# Patient Record
Sex: Female | Born: 1945 | Race: White | Hispanic: No | Marital: Married | Smoking: Never smoker
Health system: Southern US, Community
[De-identification: ages and names within clinical notes are randomized; demographics above are authoritative.]

## PROBLEM LIST (undated history)

## (undated) DIAGNOSIS — E7800 Pure hypercholesterolemia, unspecified: Secondary | ICD-10-CM

## (undated) DIAGNOSIS — I4891 Unspecified atrial fibrillation: Secondary | ICD-10-CM

## (undated) DIAGNOSIS — K5732 Diverticulitis of large intestine without perforation or abscess without bleeding: Secondary | ICD-10-CM

## (undated) DIAGNOSIS — C55 Malignant neoplasm of uterus, part unspecified: Secondary | ICD-10-CM

## (undated) DIAGNOSIS — R002 Palpitations: Secondary | ICD-10-CM

## (undated) DIAGNOSIS — K449 Diaphragmatic hernia without obstruction or gangrene: Secondary | ICD-10-CM

## (undated) DIAGNOSIS — I10 Essential (primary) hypertension: Secondary | ICD-10-CM

## (undated) DIAGNOSIS — K219 Gastro-esophageal reflux disease without esophagitis: Secondary | ICD-10-CM

## (undated) DIAGNOSIS — J45909 Unspecified asthma, uncomplicated: Secondary | ICD-10-CM

## (undated) HISTORY — PX: ABDOMINAL HYSTERECTOMY: SHX81

## (undated) HISTORY — PX: URETHRA SURGERY: SHX824

## (undated) HISTORY — PX: CHOLECYSTECTOMY: SHX55

---

## 2015-10-24 ENCOUNTER — Emergency Department (HOSPITAL_BASED_OUTPATIENT_CLINIC_OR_DEPARTMENT_OTHER): Payer: Medicare Other

## 2015-10-24 ENCOUNTER — Emergency Department (HOSPITAL_BASED_OUTPATIENT_CLINIC_OR_DEPARTMENT_OTHER)
Admission: EM | Admit: 2015-10-24 | Discharge: 2015-10-24 | Disposition: A | Discharge status: Home / Self Care | Payer: Medicare Other | Attending: Emergency Medicine | Admitting: Emergency Medicine

## 2015-10-24 ENCOUNTER — Encounter (HOSPITAL_BASED_OUTPATIENT_CLINIC_OR_DEPARTMENT_OTHER): Payer: Self-pay

## 2015-10-24 DIAGNOSIS — Z792 Long term (current) use of antibiotics: Secondary | ICD-10-CM | POA: Diagnosis not present

## 2015-10-24 DIAGNOSIS — Z8639 Personal history of other endocrine, nutritional and metabolic disease: Secondary | ICD-10-CM | POA: Insufficient documentation

## 2015-10-24 DIAGNOSIS — Z8541 Personal history of malignant neoplasm of cervix uteri: Secondary | ICD-10-CM | POA: Insufficient documentation

## 2015-10-24 DIAGNOSIS — Z7951 Long term (current) use of inhaled steroids: Secondary | ICD-10-CM | POA: Insufficient documentation

## 2015-10-24 DIAGNOSIS — Z90710 Acquired absence of both cervix and uterus: Secondary | ICD-10-CM | POA: Insufficient documentation

## 2015-10-24 DIAGNOSIS — K529 Noninfective gastroenteritis and colitis, unspecified: Secondary | ICD-10-CM | POA: Diagnosis not present

## 2015-10-24 DIAGNOSIS — Z9049 Acquired absence of other specified parts of digestive tract: Secondary | ICD-10-CM | POA: Insufficient documentation

## 2015-10-24 DIAGNOSIS — Z79899 Other long term (current) drug therapy: Secondary | ICD-10-CM | POA: Insufficient documentation

## 2015-10-24 DIAGNOSIS — Z9889 Other specified postprocedural states: Secondary | ICD-10-CM | POA: Insufficient documentation

## 2015-10-24 DIAGNOSIS — I10 Essential (primary) hypertension: Secondary | ICD-10-CM | POA: Diagnosis not present

## 2015-10-24 DIAGNOSIS — J45909 Unspecified asthma, uncomplicated: Secondary | ICD-10-CM | POA: Insufficient documentation

## 2015-10-24 DIAGNOSIS — K219 Gastro-esophageal reflux disease without esophagitis: Secondary | ICD-10-CM | POA: Insufficient documentation

## 2015-10-24 DIAGNOSIS — Z87891 Personal history of nicotine dependence: Secondary | ICD-10-CM | POA: Diagnosis not present

## 2015-10-24 DIAGNOSIS — Z7982 Long term (current) use of aspirin: Secondary | ICD-10-CM | POA: Diagnosis not present

## 2015-10-24 DIAGNOSIS — R1110 Vomiting, unspecified: Secondary | ICD-10-CM | POA: Diagnosis present

## 2015-10-24 HISTORY — DX: Unspecified asthma, uncomplicated: J45.909

## 2015-10-24 HISTORY — DX: Pure hypercholesterolemia, unspecified: E78.00

## 2015-10-24 HISTORY — DX: Diaphragmatic hernia without obstruction or gangrene: K44.9

## 2015-10-24 HISTORY — DX: Essential (primary) hypertension: I10

## 2015-10-24 HISTORY — DX: Palpitations: R00.2

## 2015-10-24 HISTORY — DX: Unspecified atrial fibrillation: I48.91

## 2015-10-24 HISTORY — DX: Diverticulitis of large intestine without perforation or abscess without bleeding: K57.32

## 2015-10-24 HISTORY — DX: Gastro-esophageal reflux disease without esophagitis: K21.9

## 2015-10-24 HISTORY — DX: Malignant neoplasm of uterus, part unspecified: C55

## 2015-10-24 LAB — I-STAT CG4 LACTIC ACID, ED: Lactic Acid, Venous: 1.28 mmol/L (ref 0.5–2.0)

## 2015-10-24 LAB — COMPREHENSIVE METABOLIC PANEL
ALT: 26 U/L (ref 14–54)
AST: 26 U/L (ref 15–41)
Albumin: 3.9 g/dL (ref 3.5–5.0)
Alkaline Phosphatase: 80 U/L (ref 38–126)
Anion gap: 8 (ref 5–15)
BUN: 22 mg/dL — ABNORMAL HIGH (ref 6–20)
CO2: 25 mmol/L (ref 22–32)
Calcium: 9 mg/dL (ref 8.9–10.3)
Chloride: 107 mmol/L (ref 101–111)
Creatinine, Ser: 0.86 mg/dL (ref 0.44–1.00)
GFR calc Af Amer: >60 mL/min (ref >60)
GFR calc non Af Amer: >60 mL/min (ref >60)
Glucose, Bld: 151 mg/dL — ABNORMAL HIGH (ref 65–99)
Potassium: 4.4 mmol/L (ref 3.5–5.1)
Sodium: 140 mmol/L (ref 135–145)
Total Bilirubin: 0.8 mg/dL (ref 0.3–1.2)
Total Protein: 6.9 g/dL (ref 6.5–8.1)

## 2015-10-24 LAB — URINALYSIS, ROUTINE W REFLEX MICROSCOPIC
Bilirubin Urine: NEGATIVE
Glucose, UA: NEGATIVE mg/dL
Hgb urine dipstick: NEGATIVE
Ketones, ur: 40 mg/dL — AB
Leukocytes, UA: NEGATIVE
Nitrite: NEGATIVE
Protein, ur: NEGATIVE mg/dL
Specific Gravity, Urine: 1.026 (ref 1.005–1.030)
pH: 6.5 (ref 5.0–8.0)

## 2015-10-24 LAB — CBC WITH DIFFERENTIAL/PLATELET
Basophils Absolute: 0 10*3/uL (ref 0.0–0.1)
Basophils Relative: 0 %
Eosinophils Absolute: 0 10*3/uL (ref 0.0–0.7)
Eosinophils Relative: 0 %
HCT: 46.4 % — ABNORMAL HIGH (ref 36.0–46.0)
Hemoglobin: 15.1 g/dL — ABNORMAL HIGH (ref 12.0–15.0)
Lymphocytes Relative: 4 %
Lymphs Abs: 0.3 10*3/uL — ABNORMAL LOW (ref 0.7–4.0)
MCH: 28.1 pg (ref 26.0–34.0)
MCHC: 32.5 g/dL (ref 30.0–36.0)
MCV: 86.4 fL (ref 78.0–100.0)
Monocytes Absolute: 0.3 10*3/uL (ref 0.1–1.0)
Monocytes Relative: 4 %
Neutro Abs: 7.7 10*3/uL (ref 1.7–7.7)
Neutrophils Relative %: 92 %
Platelets: 243 10*3/uL (ref 150–400)
RBC: 5.37 MIL/uL — ABNORMAL HIGH (ref 3.87–5.11)
RDW: 13.4 % (ref 11.5–15.5)
WBC: 8.3 10*3/uL (ref 4.0–10.5)

## 2015-10-24 MED ORDER — PROMETHAZINE HCL 25 MG/ML IJ SOLN
12.5000 mg | Freq: Once | INTRAMUSCULAR | Status: AC
  Administered 2015-10-24: 12.5 mg via INTRAVENOUS
  Filled 2015-10-24: qty 1

## 2015-10-24 MED ORDER — IOHEXOL 300 MG/ML  SOLN
100.0000 mL | Freq: Once | INTRAMUSCULAR | Status: AC | PRN
  Administered 2015-10-24: 100 mL via INTRAVENOUS

## 2015-10-24 MED ORDER — ONDANSETRON HCL 4 MG/2ML IJ SOLN
4.0000 mg | Freq: Once | INTRAMUSCULAR | Status: AC
Start: 2015-10-24 — End: 2015-10-24
  Administered 2015-10-24: 4 mg via INTRAVENOUS
  Filled 2015-10-24: qty 2

## 2015-10-24 MED ORDER — PROMETHAZINE HCL 25 MG PO TABS: 25.0000 mg | ORAL_TABLET | Freq: Three times a day (TID) | ORAL | Status: AC | PRN

## 2015-10-24 MED ORDER — SODIUM CHLORIDE 0.9 % IV BOLUS (SEPSIS)
1000.0000 mL | Freq: Once | INTRAVENOUS | Status: AC
  Administered 2015-10-24: 1000 mL via INTRAVENOUS

## 2015-10-24 MED ORDER — IOHEXOL 300 MG/ML  SOLN
50.0000 mL | Freq: Once | INTRAMUSCULAR | Status: AC | PRN
  Administered 2015-10-24: 50 mL via ORAL

## 2015-10-24 NOTE — ED Notes (Signed)
Patient transported to CT 

## 2015-10-24 NOTE — ED Notes (Signed)
PA student at bedside.

## 2015-10-24 NOTE — ED Notes (Signed)
Pt able to tolerate PO fluids and verbalizes understanding of d/c instructions and denies any further needs at this time.

## 2015-10-24 NOTE — ED Notes (Signed)
C/o vomiting since 12MN-denies diarrhea

## 2015-10-24 NOTE — Discharge Instructions (Signed)
Return here as needed.  Follow-up with your primary care doctor.  Slowly increase your fluid intake and rest as much as possible °

## 2015-10-24 NOTE — ED Provider Notes (Signed)
CSN: [id number]     Arrival date & time 10/24/15  1543 History   First MD Initiated Contact with Patient 10/24/15 1602     Chief Complaint  Patient presents with  . Vomiting   HPI  Patient is a pleasant 69 y/o WF here with a CC of persistent vomiting since midnight.  Vomit was green in color at the onset but is now yellow. No blood in vomitus. She has vomited once per hour since midnight.  She endorses vague cramping abdominal pain.  She cannot tolerate anything po and has felt lightheaded as the day progressed. Denies fever, diarrhea, recent changes in diet, recent changes in medications, or sick contacts. She had a cholecystectomy in 2012.  Patient denies weakness, dizziness, headache, blurred vision, chest pain, shortness of breath, back pain, neck pain, fever, cough, incontinence, dysuria, hematemesis, bloody stool, rash, near syncope or syncope.  The patient states that she did not take any medications prior to arrival  Past Medical History  Diagnosis Date  . Asthma   . Heart palpitations   . Uterine cancer ([place])   . A-fib ([place])   . Hernia, diaphragmatic   . Hypertension   . Diverticulitis of colon   . GERD (gastroesophageal reflux disease)   . Hypercholesteremia    Past Surgical History  Procedure Laterality Date  . Abdominal hysterectomy    . Cholecystectomy    . Urethra surgery     No family history on file. Social History  Substance Use Topics  . Smoking status: Never Smoker   . Smokeless tobacco: None  . Alcohol Use: No   OB History    No data available     Review of Systems All other systems negative except as documented in the HPI. All pertinent positives and negatives as reviewed in the HPI.    Allergies  Biaxin  Home Medications   Prior to Admission medications   Medication Sig Start Date End Date Taking? Authorizing Provider  ALBUTEROL IN Inhale into the lungs.   Yes Historical Provider, MD  aspirin EC 325 MG tablet Take 325 mg by mouth daily.   Yes  Historical Provider, MD  atenolol (TENORMIN) 50 MG tablet Take 50 mg by mouth daily.   Yes Historical Provider, MD  azelastine (ASTELIN) 0.1 % nasal spray Place into both nostrils 2 (two) times daily. Use in each nostril as directed   Yes Historical Provider, MD  azithromycin (ZITHROMAX) 250 MG tablet Take by mouth daily.   Yes Historical Provider, MD  hydrochlorothiazide (HYDRODIURIL) 25 MG tablet Take 25 mg by mouth daily.   Yes Historical Provider, MD  ibuprofen (ADVIL,MOTRIN) 200 MG tablet Take 200 mg by mouth every 6 (six) hours as needed.   Yes Historical Provider, MD  Lansoprazole (PREVACID PO) Take by mouth.   Yes Historical Provider, MD  Methylcellulose, Laxative, (MIRAFIBER PO) Take by mouth.   Yes Historical Provider, MD  Mometasone Furoate [facility] HFA IN) Inhale into the lungs.   Yes Historical Provider, MD  potassium chloride SA (K-DUR,KLOR-CON) 20 MEQ tablet Take 20 mEq by mouth 2 (two) times daily.   Yes Historical Provider, MD  verapamil (VERELAN PM) 240 MG 24 hr capsule Take 240 mg by mouth at bedtime.   Yes Historical Provider, MD   BP 158/68 mmHg  Pulse 88  Temp(Src) 98.2 F (36.8 C) (Oral)  Resp 18  Ht 5\' 5"  (1.651 m)  Wt 104.327 kg  BMI 38.27 kg/m2  SpO2 98% Physical Exam  Constitutional: She  is oriented to person, place, and time. Vital signs are normal. She appears well-developed and well-nourished.  Non-toxic appearance. No distress.  HENT:  Head: Normocephalic and atraumatic.  Mouth/Throat: Oropharynx is clear and moist.  Eyes: Pupils are equal, round, and reactive to light.  Neck: Normal range of motion. Neck supple.  Cardiovascular: Normal rate, regular rhythm and normal heart sounds.  Exam reveals no gallop and no friction rub.   No murmur heard. Pulmonary/Chest: Effort normal and breath sounds normal. No respiratory distress. She has no wheezes.  Abdominal: Soft. Normal appearance and bowel sounds are normal. She exhibits no distension and no fluid wave.  There is tenderness. There is no rebound and no guarding.    Neurological: She is alert and oriented to person, place, and time. She exhibits normal muscle tone. Coordination normal.  Skin: Skin is warm and dry. No rash noted. She is not diaphoretic. No erythema.  Psychiatric: She has a normal mood and affect. Her behavior is normal.  Nursing note and vitals reviewed.   ED Course  Procedures (including critical care time) Labs Review Labs Reviewed  COMPREHENSIVE METABOLIC PANEL - Abnormal; Notable for the following:    Glucose, Bld 151 (*)    BUN 22 (*)    All other components within normal limits  CBC WITH DIFFERENTIAL/PLATELET - Abnormal; Notable for the following:    RBC 5.37 (*)    Hemoglobin 15.1 (*)    HCT 46.4 (*)    Lymphs Abs 0.3 (*)    All other components within normal limits  URINALYSIS, ROUTINE W REFLEX MICROSCOPIC (NOT AT [facility])  I-STAT CG4 LACTIC ACID, ED    Imaging Review No results found. I have personally reviewed and evaluated these images and lab results as part of my medical decision-making.   EKG Interpretation None      Assessment & Plan  Nausea relief with zofran, phenergan  IV fluid resuscitation   Patient has no significant abnormalities noted on CT scan.  She will be referred back to her primary care doctor miscellaneous gastroenteritis based on her history of present illness and physical exam findings.  Patient agrees to plan and all questions were answered  Dalia Heading, PA-C 10/25/15 2253  Leonard Schwartz, MD 10/26/15 [phone]

## 2016-05-02 IMAGING — CT CT ABD-PELV W/ CM
2 of 5 series · 16 of 46 positions shown, 18 images · IV contrast (omnipaque)
Comparison: None.

CLINICAL DATA: Vomiting and generalized abdominal pain.

EXAM:
CT ABDOMEN AND PELVIS WITH CONTRAST
TECHNIQUE: Multidetector CT imaging of the abdomen and pelvis was performed
using the standard protocol following bolus administration of
intravenous contrast.
CONTRAST:  50mL OMNIPAQUE IOHEXOL 300 MG/ML SOLN oral, 100mL
OMNIPAQUE IOHEXOL 300 MG/ML SOLN IV

[Series 2: abd/pelvis 5.0 b31f · axial · 0.82mm/px · z∈[+881,+1286]mm · 13 of 93 slices shown, 15 images]
[im 6/93  soft-tissue]
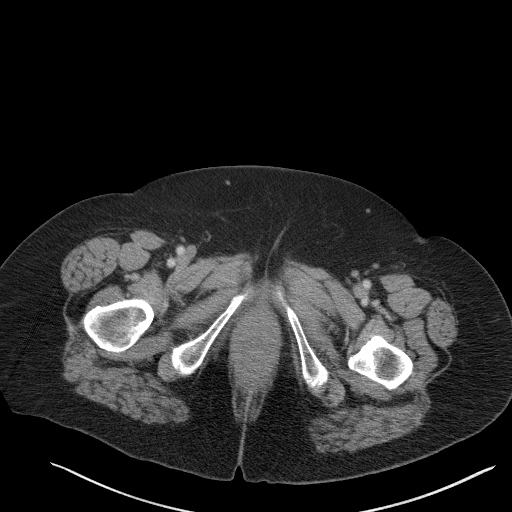
[im 6/93  bone]
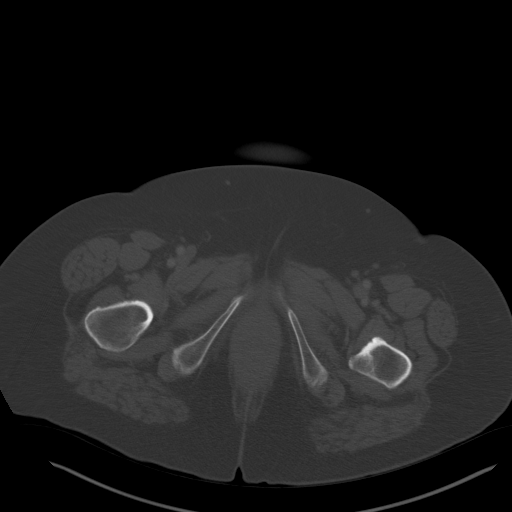
[im 11/93  soft-tissue]
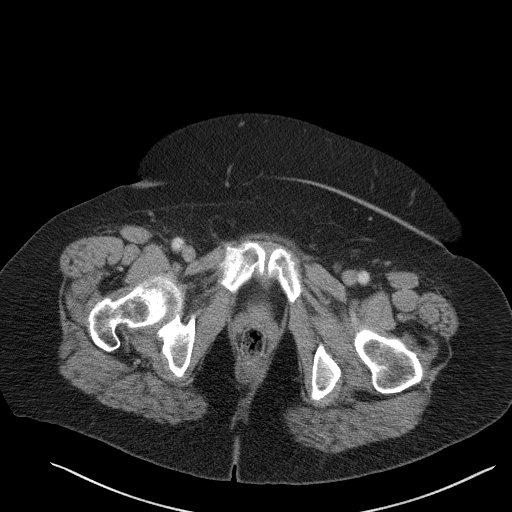
[im 21/93  soft-tissue]
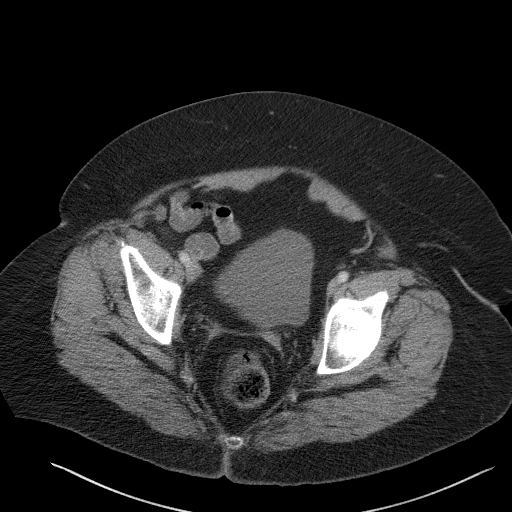
[im 26/93  soft-tissue]
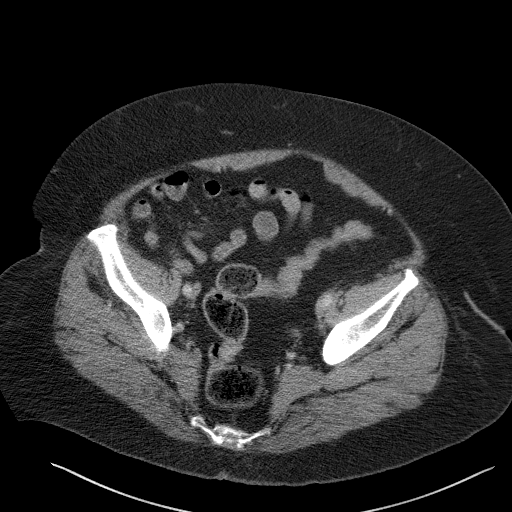
[im 31/93  soft-tissue]
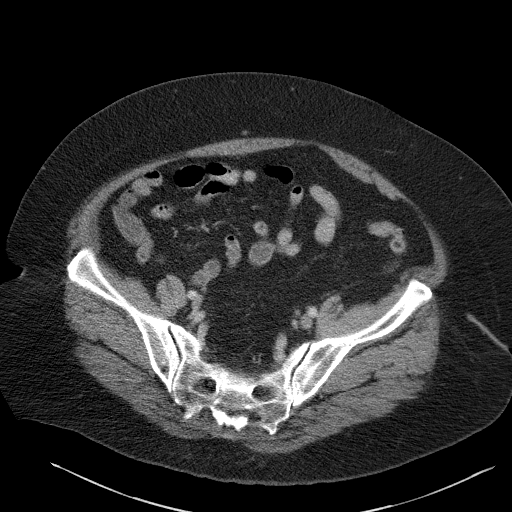
[im 41/93  soft-tissue]
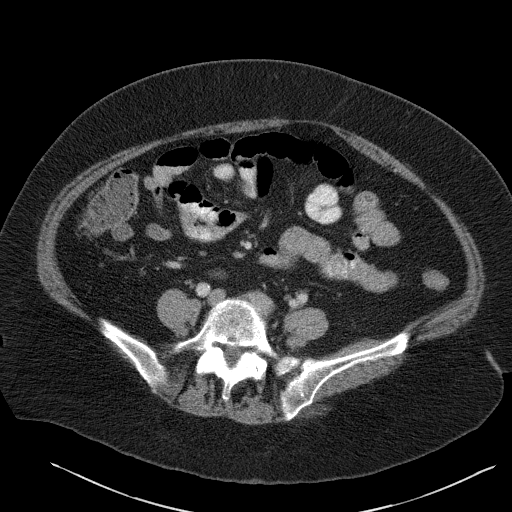
[im 47/93  soft-tissue]
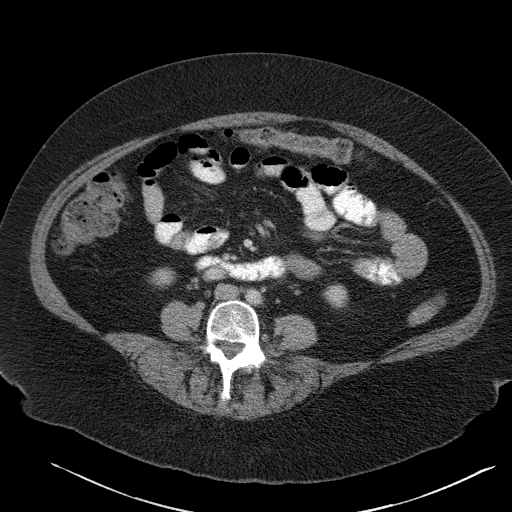
[im 52/93  soft-tissue]
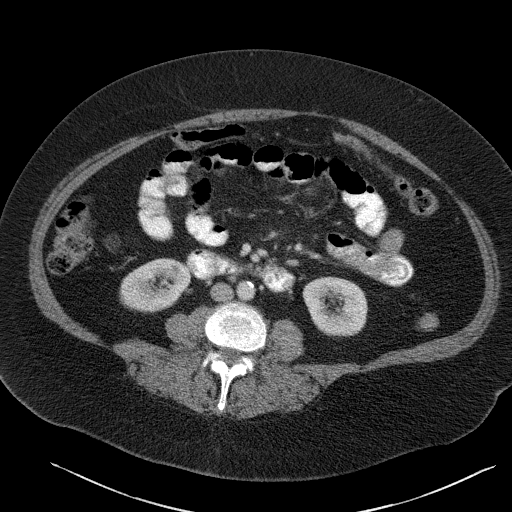
[im 62/93  soft-tissue]
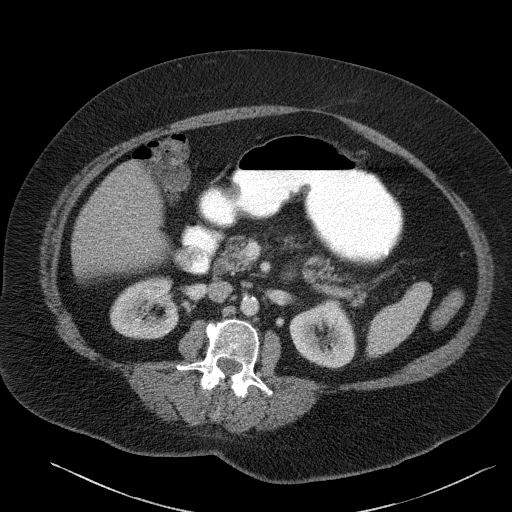
[im 62/93  bone]
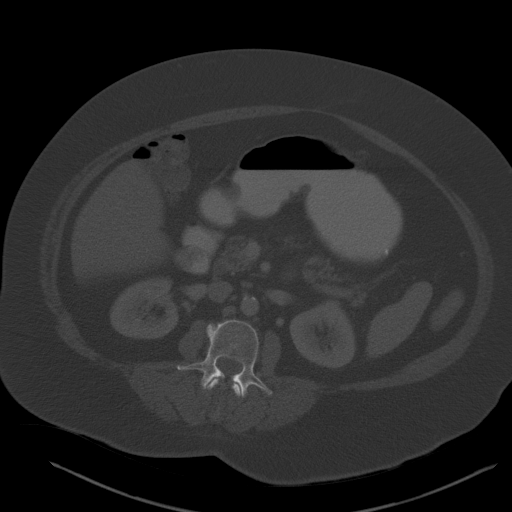
[im 67/93  soft-tissue]
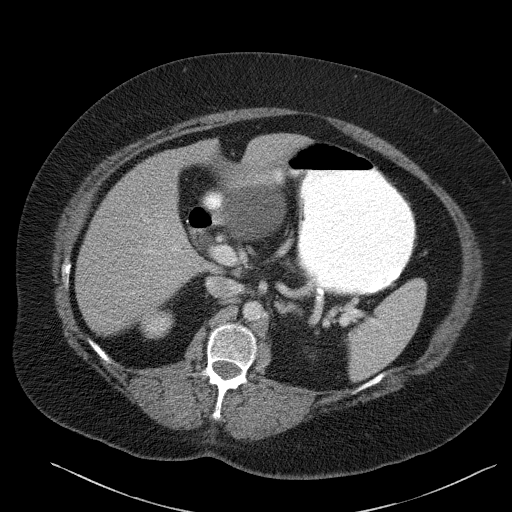
[im 72/93  soft-tissue]
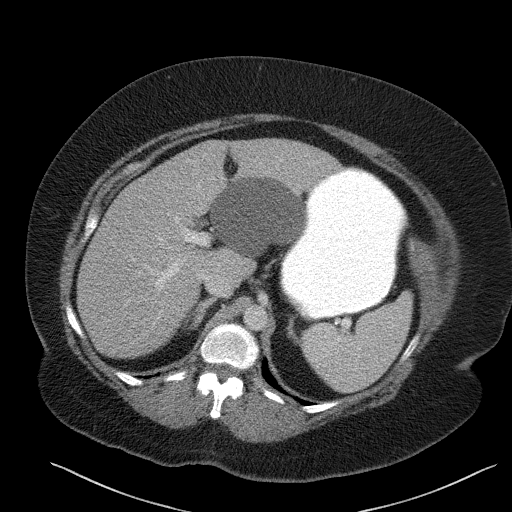
[im 82/93  soft-tissue]
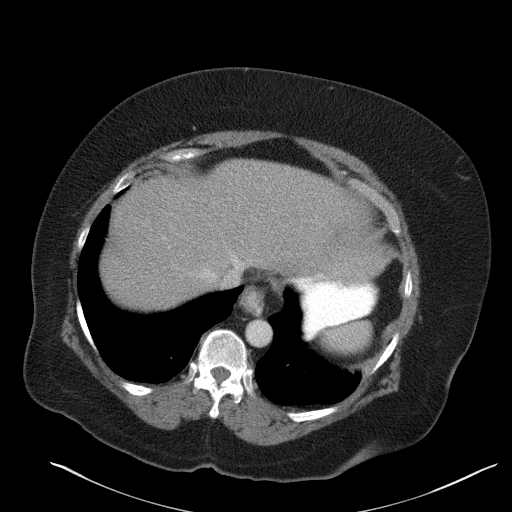
[im 87/93  soft-tissue]
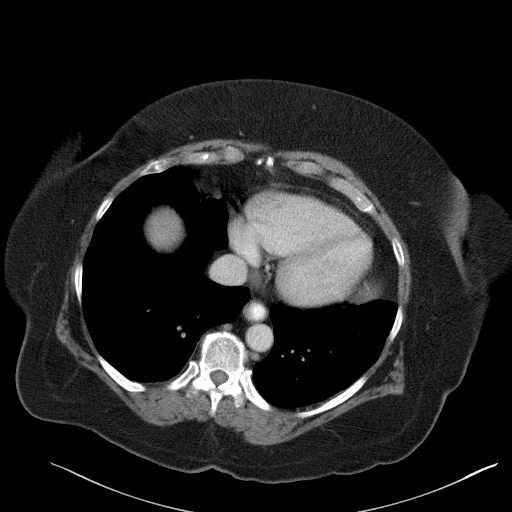

[Series 5: abd/pelvis 3.0 coronal · coronal · 0.87mm/px · 3 of 114 slices shown]
[im 38/114  soft-tissue]
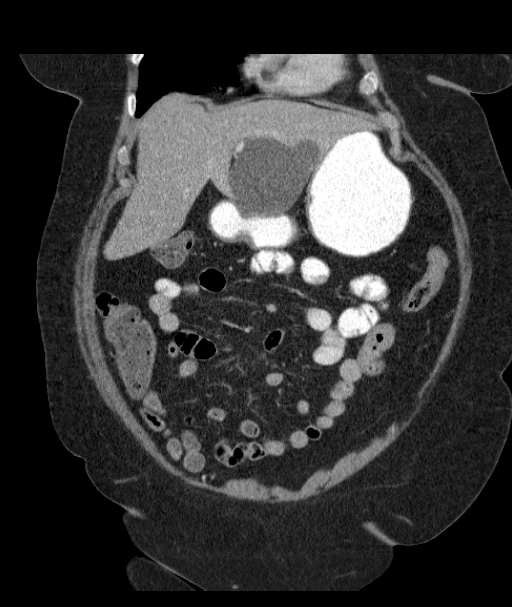
[im 51/114  soft-tissue]
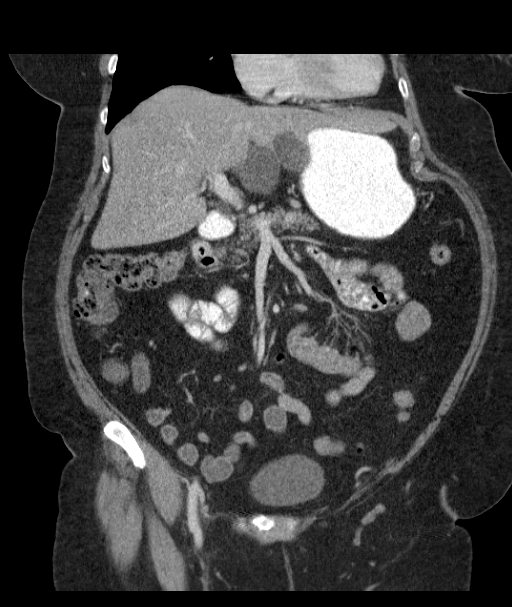
[im 63/114  soft-tissue]
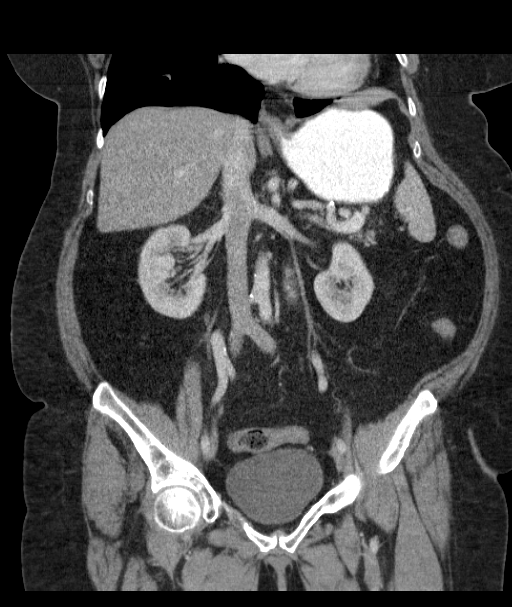

[16 of 46 positions shown; findings below may reference images not displayed]

FINDINGS: Cyst of the posterior left lobe of the liver measures approximately
8.0 x 6.7 x 7.3 cm and demonstrates internal density consistent with
a simple cyst. No other hepatic abnormalities. The gallbladder has
been removed. There is no evidence of biliary ductal dilatation.

The pancreas is atrophic. The spleen, adrenal glands and kidneys are
within normal limits. No masses or lymphadenopathy identified.

Bowel shows no evidence of obstruction, ileus or inflammation. The
appendix is normal. No free fluid or abnormal fluid collection
identified. Prior hysterectomy. The bladder is unremarkable. Small
anterior abdominal wall hernia defect contains fat and is located to
the left of midline and superior to the umbilicus. The spine
demonstrates degenerative disc disease at the L2-3, L4-5 and L5-S1
levels.
IMPRESSION: 1. No acute findings.
2. Moderate-sized posterior left lobe hepatic cyst measuring 8 cm in
greatest diameter. This appears to represent a simple cyst by CT.
3. Small ventral hernia containing fat.
